# Patient Record
Sex: Female | Born: 1995 | Race: White | Hispanic: No | Marital: Single | Smoking: Never smoker
Health system: Southern US, Community
[De-identification: ages and names within clinical notes are randomized; demographics above are authoritative.]

## PROBLEM LIST (undated history)

## (undated) DIAGNOSIS — F32A Depression, unspecified: Secondary | ICD-10-CM

## (undated) DIAGNOSIS — K219 Gastro-esophageal reflux disease without esophagitis: Secondary | ICD-10-CM

## (undated) DIAGNOSIS — F419 Anxiety disorder, unspecified: Secondary | ICD-10-CM

## (undated) DIAGNOSIS — E282 Polycystic ovarian syndrome: Secondary | ICD-10-CM

## (undated) HISTORY — PX: ADENOIDECTOMY: SUR15

## (undated) HISTORY — PX: OTHER SURGICAL HISTORY: SHX169

---
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
## 2018-01-24 ENCOUNTER — Telehealth: Payer: Self-pay | Admitting: *Deleted

## 2018-01-24 NOTE — Telephone Encounter (Signed)
RN returned call regarding travel advice for trip 02/02/2018. RN left message asking for a call back, asking for [place]. Andree CossHowell, Niya Behler M, RN

## 2018-01-25 ENCOUNTER — Encounter: Payer: Self-pay | Admitting: Internal Medicine

## 2018-01-25 ENCOUNTER — Ambulatory Visit (INDEPENDENT_AMBULATORY_CARE_PROVIDER_SITE_OTHER): Payer: PRIVATE HEALTH INSURANCE | Admitting: Internal Medicine

## 2018-01-25 DIAGNOSIS — Z789 Other specified health status: Secondary | ICD-10-CM

## 2018-01-25 DIAGNOSIS — Z7184 Encounter for health counseling related to travel: Secondary | ICD-10-CM

## 2018-01-25 DIAGNOSIS — Z298 Encounter for other specified prophylactic measures: Secondary | ICD-10-CM

## 2018-01-25 DIAGNOSIS — Z9189 Other specified personal risk factors, not elsewhere classified: Secondary | ICD-10-CM

## 2018-01-25 DIAGNOSIS — Z7189 Other specified counseling: Secondary | ICD-10-CM

## 2018-01-25 DIAGNOSIS — Z7185 Encounter for immunization safety counseling: Secondary | ICD-10-CM

## 2018-01-25 MED ORDER — ATOVAQUONE-PROGUANIL HCL 250-100 MG PO TABS: 1.0000 | ORAL_TABLET | Freq: Every day | ORAL | 0 refills | Status: DC

## 2018-01-25 MED ORDER — AZITHROMYCIN 500 MG PO TABS: 1000.0000 mg | ORAL_TABLET | Freq: Once | ORAL | 0 refills | Status: AC

## 2018-01-25 MED ORDER — AZITHROMYCIN 500 MG PO TABS: 1000.0000 mg | ORAL_TABLET | Freq: Once | ORAL | 0 refills | Status: DC

## 2018-01-25 NOTE — Addendum Note (Signed)
Addended by: Valarie ConesSTALEY, Jamarrion Budai on: 01/25/2018 11:19 AM   Modules accepted: Orders

## 2018-01-25 NOTE — Progress Notes (Signed)
Subjective:   Karen Day is a 23 y.o. female who presents to the Infectious Disease clinic for travel consultation. Planned departure date: January 2020          Planned return date: 10 days Countries of travel: [place]  Areas in country: urban   Accommodations: hotel Purpose of travel: vacation Prior travel out of [place]: yes     Objective:   Medications: reviewed    Assessment:   No contraindications to travel. none     Plan:    Issues discussed: environmental concerns, freshwater swimming, future shots, insect-borne illnesses, Japanese encephalitis, malaria, MVA safety, rabies, safe food/water, traveler's diarrhea, website/handouts for more information, what to do if ill upon return and what to do if ill while there. Immunizations recommended: none indicated. Malaria prophylaxis: malarone, daily dose starting 1-2 days before entering endemic area, ending 7 days after leaving area Traveler's diarrhea prophylaxis: azithromycin. Total duration of visit: 1 Hour. Total time spent on education, counseling, coordination of care: 30 Minutes.

## 2018-01-25 NOTE — Patient Instructions (Signed)
[facility]                [address], [address]                   [place], [place] [address]                      Phone: [phone]                        Fax: [phone]   Planned departure date: January 2020        Planned return date:10 days Countries of travel: [place]    Guidelines for the Prevention & Treatment of Traveler's Diarrhea  Prevention: "Boil it, Peel it, Adriana Simas it, or Forget it"   the fewer chances -> lower risk: try to stick to food & water precautions as much as possible"   If it's "piping hot"; it is probably okay, if not, it may not be   Treatment   1) You should always take care to drink lots of fluids in order to avoid dehydration   2) You should bring medications with you in case you come down with a case of diarrhea   3) OTC = bring pepto-bismol - can take with initial abdominal symptoms;                    Imodium - can help slow down your intestinal tract, can help relief cramps                    and diarrhea, can take if no bloody diarrhea  Use azithromycin if needed for traveler's diarrhea  Guidelines for the Prevention of Malaria  Avoidance:  -fewer mosquito bites = lower risk. Mosquitos can bite at night as well as daytime  -cover up (long sleeve clothing), mosquito nets, screens  -Insect repellent for your skin ( DEET containing lotion > 20%): for clothes ( permethrin spray) www.insectshield.com (878)249-7099) for pretreated permethrin  2 days prior to travel, start malarone, daily dose starting 1-2 days before entering endemic area, ending 7 days after leaving area for malaria prevention.   Immunizations received today: none indicated  Future immunizations, if indicated none indicated   Prior to travel:  1) Be sure to pick up appropriate prescriptions, including medicine you take daily. Do not expect to be able to fill your prescriptions abroad.  2) Strongly consider obtaining traveler's  insurance, including emergency evacuation insurance. Most plans in the [place] do not cover participants abroad. (see below for resources)  3) Register at the appropriate [place] embassy or consulate with travel dates so they are aware of your presence in-country and for helpful advice during travel using the [employer] (STEP, GuyGalaxy.si).  4) Leave contact information with a relative or friend.  5) Keep a Corporate treasurer, credit cards in case they become lost or stolen  6) Inform your credit card company that you will be travelling abroad   During travel:  1) If you become ill and need medical advice, the [place] [employer] of the country you are traveling in general provides a list of English speaking doctors.  We are also available on MyChart for remote consultation if you register prior to travel. 2) Avoid motorcycles or scooters when at all possible. Traffic laws in many countries are lax and accidents occur frequently.  3) Do  not take any unnecessary risks that you wouldn't do at home.   Resources:  -Country specific information: BlindResource.ca or GreenNylon.com.cy  -Press photographer (DEET, mosquito nets): [employer], [employer] store, [employer], [place]ell insurance options: gatewayplans.com; http://clayton-rivera.info/; travelguard.com or Good Pilgrim's Pride, gninsurance.com or info@gninsurance .com, (680) 369-0561.   Post Travel:  If you return from your trip ill, call your primary care doctor or our travel clinic @ [phone].   Enjoy your trip and know that with proper pre-travel preparation, most people have an enjoyable and uninterrupted trip!

## 2018-03-11 LAB — HM HIV SCREENING LAB: HM HIV Screening: NEGATIVE

## 2018-03-11 LAB — HM PAP SMEAR: HM Pap smear: NEGATIVE

## 2018-09-02 ENCOUNTER — Telehealth: Payer: Self-pay | Admitting: Family Medicine

## 2018-09-02 NOTE — Telephone Encounter (Signed)
TC to patient who is concerned about facial skin (chin) breaking out in pimples and wondering if its related to [facility] which was placed in March, per patient. Patient talked with RN and provider, C. Hampton on speaker phone and was told that there is a possibility that the skin issues are related, but that it could also be caused by wearing facial masks also since about the time she had IUD placed. Patient told she could try OTC facial washes, and that if she wants IUD removed she can make an appointment. Patient states she'll think about it.Jenetta Downer, RN

## 2018-09-03 NOTE — Telephone Encounter (Signed)
Present when RN was on phone with patient.  Both RN and I spoke with the patient and the documentation  By RN reflects our discussion with patient re:  Acne as SE of Skyla and options for patient to consider.  Patient states that she will call back if she wants IUD removal and to start OCs.

## 2018-09-10 ENCOUNTER — Ambulatory Visit: Payer: PRIVATE HEALTH INSURANCE

## 2019-11-06 ENCOUNTER — Encounter: Payer: Self-pay | Admitting: Obstetrics and Gynecology

## 2020-12-12 ENCOUNTER — Ambulatory Visit (HOSPITAL_COMMUNITY): Admit: 2020-12-12 | Discharge status: Against Medical Advice | Payer: 59

## 2020-12-13 ENCOUNTER — Other Ambulatory Visit: Payer: Self-pay

## 2020-12-13 ENCOUNTER — Ambulatory Visit (INDEPENDENT_AMBULATORY_CARE_PROVIDER_SITE_OTHER): Payer: 59

## 2020-12-13 ENCOUNTER — Encounter (HOSPITAL_COMMUNITY): Payer: Self-pay

## 2020-12-13 ENCOUNTER — Ambulatory Visit (HOSPITAL_COMMUNITY)
Admission: RE | Admit: 2020-12-13 | Discharge: 2020-12-13 | Disposition: A | Discharge status: Home / Self Care | Payer: 59 | Source: Ambulatory Visit | Attending: Emergency Medicine | Admitting: Emergency Medicine

## 2020-12-13 ENCOUNTER — Ambulatory Visit
Admission: EM | Admit: 2020-12-13 | Discharge: 2020-12-13 | Disposition: A | Discharge status: Home / Self Care | Payer: 59 | Attending: Emergency Medicine | Admitting: Emergency Medicine

## 2020-12-13 VITALS — BP 118/75 | HR 68 | Temp 97.4°F | Resp 18

## 2020-12-13 DIAGNOSIS — M79672 Pain in left foot: Secondary | ICD-10-CM | POA: Diagnosis not present

## 2020-12-13 DIAGNOSIS — S92002A Unspecified fracture of left calcaneus, initial encounter for closed fracture: Secondary | ICD-10-CM

## 2020-12-13 DIAGNOSIS — M62838 Other muscle spasm: Secondary | ICD-10-CM | POA: Diagnosis not present

## 2020-12-13 DIAGNOSIS — S92015A Nondisplaced fracture of body of left calcaneus, initial encounter for closed fracture: Secondary | ICD-10-CM | POA: Diagnosis not present

## 2020-12-13 NOTE — ED Triage Notes (Signed)
Pt here for x-ray that her ins would not pay for out -Pt x-ray.
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
## 2020-12-13 NOTE — Discharge Instructions (Addendum)
Please report to [facility] main entrance to let them know that you have an x-ray ordered of your left foot.  Once I receive the results of that order we will contact you with further recommendations.  In the meantime, recommend supportive wrapping of the foot with an Ace wrap or brace, rest, ice and elevation.  For the muscle spasm in your neck, deep tissue massage, ice, heat are all helpful.  I anticipate that your discomfort will resolve in the next 2 to 3 days.  Thank you for visiting urgent care today, happy for better soon.

## 2020-12-13 NOTE — ED Notes (Signed)
Ortho tech called 
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
## 2020-12-13 NOTE — Discharge Instructions (Signed)
You have a fracture of your left heel  Please place boot on whenever walking, this is very important as it will keep your fracture from moving  Activity as tolerated  Follow-up with orthopedics in 1 to 2 weeks for reevaluation and timeline for healing, [facility] is located in most locations and you can start here if out-of-state   May use over-the-counter Tylenol or ibuprofen as needed for pain  Can apply heat or ice in 15-minute intervals for additional comfort

## 2022-06-23 IMAGING — DX DG FOOT COMPLETE 3+V*L*
3 series · 3 of 3 positions shown · non-contrast
Comparison: None.

CLINICAL DATA: Left foot injury 3 days ago

EXAM:
LEFT FOOT - COMPLETE 3+ VIEW

[foot ap]
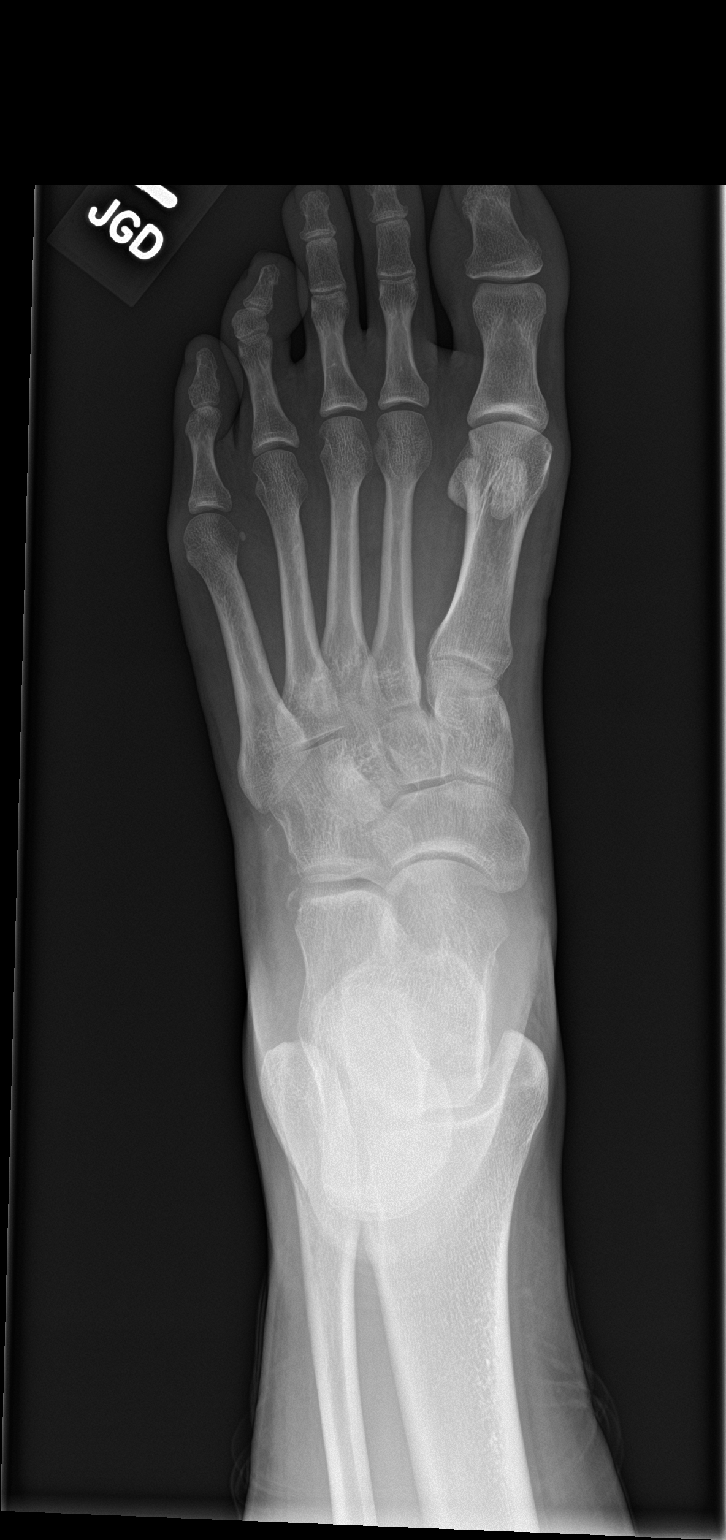

[foot obl]
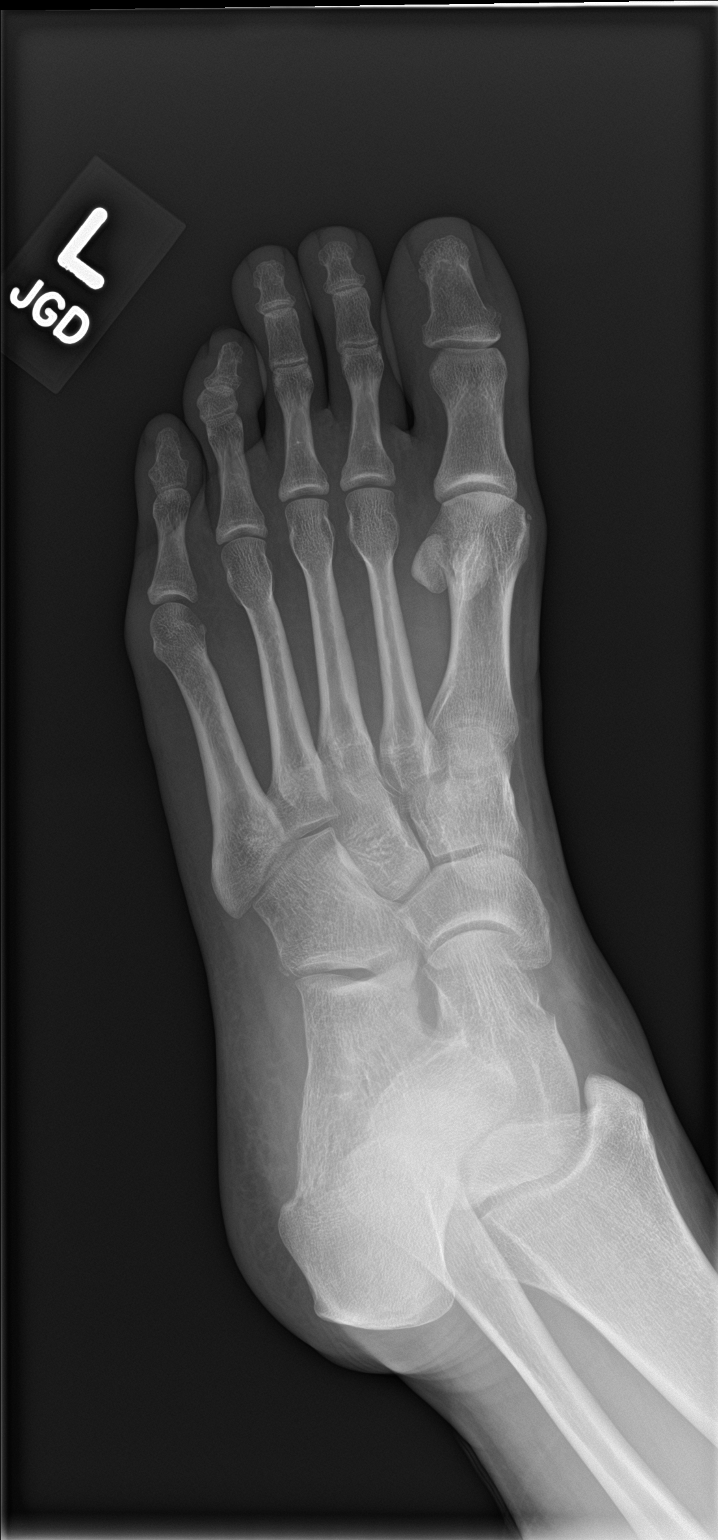

[foot lat]
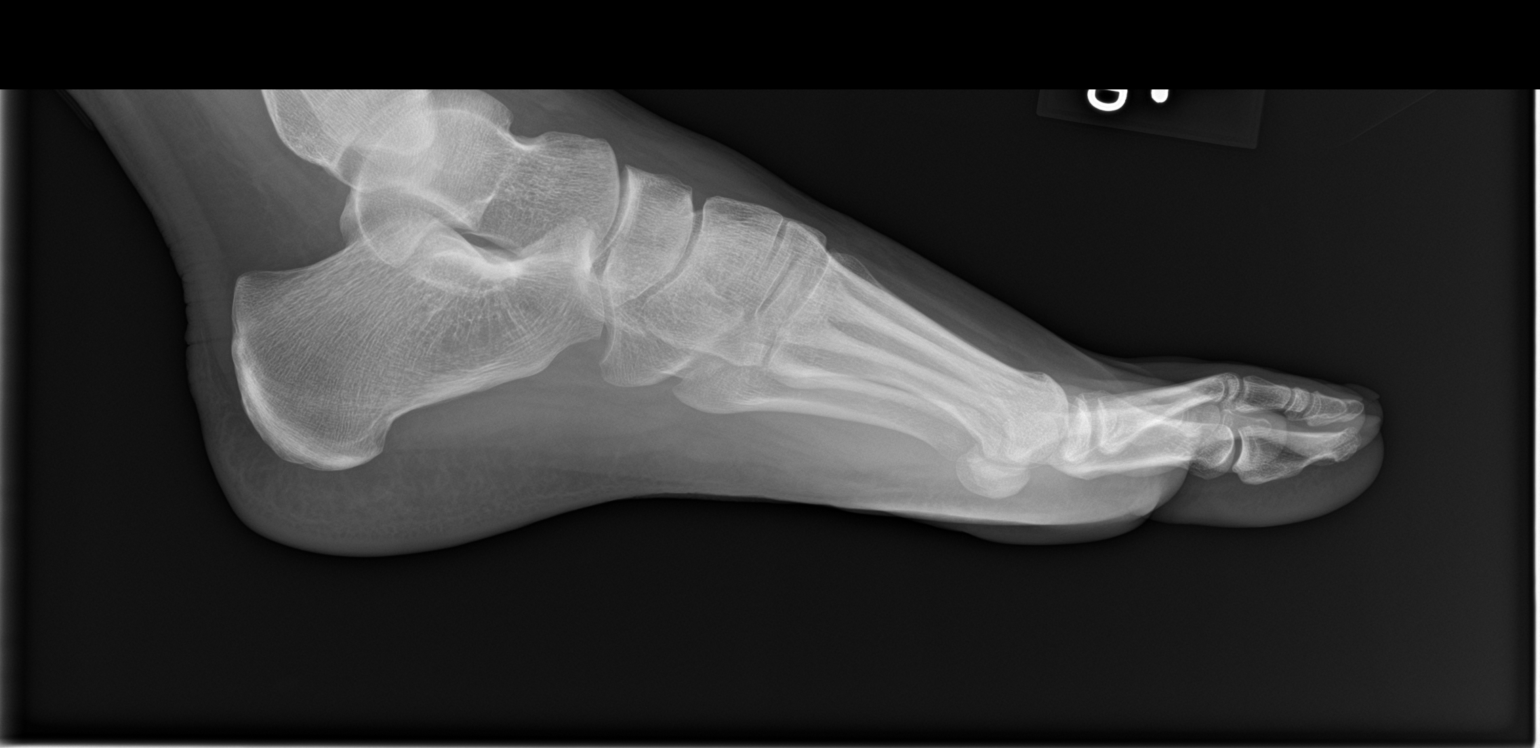

[3 of 3 positions shown; findings below may reference images not displayed]

FINDINGS: Frontal, oblique, and lateral views of the left foot are obtained.
There is a small avulsion fracture off the anterior aspect of the
calcaneus, along the anterolateral margin of the calcaneocuboid
joint. No other acute displaced fractures. Joint spaces are well
preserved. Soft tissues are normal.
IMPRESSION: 1. Small avulsion fracture off the distal calcaneus, extending along
the calcaneocuboid joint.

## 2023-02-15 ENCOUNTER — Other Ambulatory Visit: Payer: Self-pay

## 2023-02-15 ENCOUNTER — Ambulatory Visit
Admission: RE | Admit: 2023-02-15 | Discharge: 2023-02-15 | Disposition: A | Discharge status: Home / Self Care | Payer: Managed Care, Other (non HMO) | Source: Ambulatory Visit | Attending: Orthopedic Surgery | Admitting: Orthopedic Surgery

## 2023-02-15 ENCOUNTER — Ambulatory Visit: Payer: Self-pay | Admitting: Student

## 2023-02-15 ENCOUNTER — Encounter (HOSPITAL_COMMUNITY): Payer: Self-pay | Admitting: Student

## 2023-02-15 ENCOUNTER — Other Ambulatory Visit: Payer: Self-pay | Admitting: Orthopedic Surgery

## 2023-02-15 DIAGNOSIS — S82142A Displaced bicondylar fracture of left tibia, initial encounter for closed fracture: Secondary | ICD-10-CM

## 2023-02-15 NOTE — Progress Notes (Signed)
PCP - Merri Brunette     Cardiologist -   PPM/ICD - denies Device Orders - n/a Rep Notified - n/a  Chest x-ray - denies EKG - denies Stress Test - denies ECHO - denies Cardiac Cath - denies  CPAP - denies  Dm Denies  Blood Thinner Instructions: Denies Aspirin Instructions: n/a  ERAS Protcol - NPO  COVID TEST- n/a  Anesthesia review: no  Patient verbally denies any shortness of breath, fever, cough and chest pain during phone call   -------------  SDW INSTRUCTIONS given:  Your procedure is scheduled on February 16, 2023.  Report to [facility] Main Entrance "A" at 5:30 A.M., and check in at the Admitting office.  Call this number if you have problems the morning of surgery:  [phone]   Remember:  Do not eat or drink after midnight the night before your surgery      Take these medicines the morning of surgery with A SIP OF WATER  norethindrone-ethinyl estradiol (JUNEL FE,GILDESS FE,LOESTRIN FE)    As of today, STOP taking any Aspirin (unless otherwise instructed by your surgeon) Aleve, Naproxen, Ibuprofen, Motrin, Advil, Goody's, BC's, all herbal medications, fish oil, and all vitamins.                      Do not wear jewelry, make up, or nail polish            Do not wear lotions, powders, perfumes/colognes, or deodorant.            Do not shave 48 hours prior to surgery.  Men may shave face and neck.            Do not bring valuables to the hospital.            [facility] is not responsible for any belongings or valuables.  Do NOT Smoke (Tobacco/Vaping) 24 hours prior to your procedure If you use a CPAP at night, you may bring all equipment for your overnight stay.   Contacts, glasses, dentures or bridgework may not be worn into surgery.      For patients admitted to the hospital, discharge time will be determined by your treatment team.   Patients discharged the day of surgery will not be allowed to drive home, and someone needs to stay with them for 24  hours.    Special instructions:   [place]- Preparing For Surgery  Before surgery, you can play an important role. Because skin is not sterile, your skin needs to be as free of germs as possible. You can reduce the number of germs on your skin by washing with CHG (chlorahexidine gluconate) Soap before surgery.  CHG is an antiseptic cleaner which kills germs and bonds with the skin to continue killing germs even after washing.    Oral Hygiene is also important to reduce your risk of infection.  Remember - BRUSH YOUR TEETH THE MORNING OF SURGERY WITH YOUR REGULAR TOOTHPASTE  Please do not use if you have an allergy to CHG or antibacterial soaps. If your skin becomes reddened/irritated stop using the CHG.  Do not shave (including legs and underarms) for at least 48 hours prior to first CHG shower. It is OK to shave your face.  Please follow these instructions carefully.   Shower the NIGHT BEFORE SURGERY and the MORNING OF SURGERY with DIAL Soap.   Pat yourself dry with a CLEAN TOWEL.  Wear CLEAN PAJAMAS to bed the night before surgery  Place CLEAN SHEETS on your bed the night of your first shower and DO NOT SLEEP WITH PETS.   Day of Surgery: Please shower morning of surgery  Wear Clean/Comfortable clothing the morning of surgery Do not apply any deodorants/lotions.   Remember to brush your teeth WITH YOUR REGULAR TOOTHPASTE.   Questions were answered. Patient verbalized understanding of instructions.

## 2023-02-16 ENCOUNTER — Ambulatory Visit (HOSPITAL_COMMUNITY): Payer: Managed Care, Other (non HMO)

## 2023-02-16 ENCOUNTER — Ambulatory Visit (HOSPITAL_COMMUNITY): Payer: Managed Care, Other (non HMO) | Admitting: Anesthesiology

## 2023-02-16 ENCOUNTER — Encounter (HOSPITAL_COMMUNITY): Payer: Self-pay | Admitting: Student

## 2023-02-16 ENCOUNTER — Other Ambulatory Visit: Payer: Self-pay

## 2023-02-16 ENCOUNTER — Ambulatory Visit (HOSPITAL_COMMUNITY)
Admission: RE | Admit: 2023-02-16 | Discharge: 2023-02-16 | Disposition: A | Discharge status: Home / Self Care | Payer: Managed Care, Other (non HMO) | Attending: Student | Admitting: Student

## 2023-02-16 ENCOUNTER — Ambulatory Visit (HOSPITAL_BASED_OUTPATIENT_CLINIC_OR_DEPARTMENT_OTHER): Payer: Managed Care, Other (non HMO) | Admitting: Anesthesiology

## 2023-02-16 ENCOUNTER — Encounter (HOSPITAL_COMMUNITY)
Admission: RE | Disposition: A | Discharge status: Home / Self Care | Payer: Self-pay | Source: Home / Self Care | Attending: Student

## 2023-02-16 DIAGNOSIS — Y9323 Activity, snow (alpine) (downhill) skiing, snow boarding, sledding, tobogganing and snow tubing: Secondary | ICD-10-CM | POA: Diagnosis not present

## 2023-02-16 DIAGNOSIS — S82142A Displaced bicondylar fracture of left tibia, initial encounter for closed fracture: Secondary | ICD-10-CM | POA: Diagnosis present

## 2023-02-16 DIAGNOSIS — F418 Other specified anxiety disorders: Secondary | ICD-10-CM | POA: Diagnosis not present

## 2023-02-16 HISTORY — DX: Depression, unspecified: F32.A

## 2023-02-16 HISTORY — PX: ORIF TIBIA PLATEAU: SHX2132

## 2023-02-16 HISTORY — DX: Anxiety disorder, unspecified: F41.9

## 2023-02-16 HISTORY — DX: Polycystic ovarian syndrome: E28.2

## 2023-02-16 LAB — CBC
HCT: 38.5 % (ref 36.0–46.0)
Hemoglobin: 12.5 g/dL (ref 12.0–15.0)
MCH: 27.8 pg (ref 26.0–34.0)
MCHC: 32.5 g/dL (ref 30.0–36.0)
MCV: 85.7 fL (ref 80.0–100.0)
Platelets: 238 10*3/uL (ref 150–400)
RBC: 4.49 MIL/uL (ref 3.87–5.11)
RDW: 13.8 % (ref 11.5–15.5)
WBC: 10.8 10*3/uL — ABNORMAL HIGH (ref 4.0–10.5)
nRBC: 0 % (ref 0.0–0.2)

## 2023-02-16 LAB — POCT PREGNANCY, URINE: Preg Test, Ur: NEGATIVE

## 2023-02-16 SURGERY — OPEN REDUCTION INTERNAL FIXATION (ORIF) TIBIAL PLATEAU
Anesthesia: General | Site: Leg Lower | Laterality: Left

## 2023-02-16 MED ORDER — OXYCODONE HCL 5 MG/5ML PO SOLN: 5.0000 mg | Freq: Once | ORAL | Status: AC | PRN

## 2023-02-16 MED ORDER — ACETAMINOPHEN 500 MG PO TABS: 500.0000 mg | ORAL_TABLET | Freq: Four times a day (QID) | ORAL | Status: AC | PRN

## 2023-02-16 MED ORDER — METHOCARBAMOL 500 MG PO TABS: 500.0000 mg | ORAL_TABLET | Freq: Three times a day (TID) | ORAL | 0 refills | Status: AC | PRN

## 2023-02-16 MED ORDER — ONDANSETRON HCL 4 MG/2ML IJ SOLN
INTRAMUSCULAR | Status: AC
  Filled 2023-02-16: qty 2

## 2023-02-16 MED ORDER — PROPOFOL 10 MG/ML IV BOLUS
INTRAVENOUS | Status: DC | PRN
  Administered 2023-02-16: 200 mg via INTRAVENOUS

## 2023-02-16 MED ORDER — OXYCODONE HCL 5 MG PO TABS
5.0000 mg | ORAL_TABLET | Freq: Once | ORAL | Status: AC | PRN
  Administered 2023-02-16: 5 mg via ORAL

## 2023-02-16 MED ORDER — PROPOFOL 10 MG/ML IV BOLUS
INTRAVENOUS | Status: AC
  Filled 2023-02-16: qty 20

## 2023-02-16 MED ORDER — ONDANSETRON 4 MG PO TBDP: 4.0000 mg | ORAL_TABLET | Freq: Three times a day (TID) | ORAL | 0 refills | Status: AC | PRN

## 2023-02-16 MED ORDER — KETOROLAC TROMETHAMINE 15 MG/ML IJ SOLN
INTRAMUSCULAR | Status: DC | PRN
  Administered 2023-02-16: 15 mg via INTRAVENOUS

## 2023-02-16 MED ORDER — FENTANYL CITRATE (PF) 250 MCG/5ML IJ SOLN
INTRAMUSCULAR | Status: DC | PRN
  Administered 2023-02-16 (×2): 25 ug via INTRAVENOUS
  Administered 2023-02-16: 50 ug via INTRAVENOUS
  Administered 2023-02-16: 100 ug via INTRAVENOUS

## 2023-02-16 MED ORDER — VANCOMYCIN HCL 1000 MG IV SOLR
INTRAVENOUS | Status: DC | PRN
  Administered 2023-02-16: 1000 mg

## 2023-02-16 MED ORDER — ONDANSETRON HCL 4 MG/2ML IJ SOLN
INTRAMUSCULAR | Status: DC | PRN
  Administered 2023-02-16: 4 mg via INTRAVENOUS

## 2023-02-16 MED ORDER — CEFAZOLIN SODIUM-DEXTROSE 2-4 GM/100ML-% IV SOLN
2.0000 g | INTRAVENOUS | Status: AC
  Administered 2023-02-16: 2 g via INTRAVENOUS
  Filled 2023-02-16: qty 100

## 2023-02-16 MED ORDER — MIDAZOLAM HCL 2 MG/2ML IJ SOLN
INTRAMUSCULAR | Status: AC
  Filled 2023-02-16: qty 2

## 2023-02-16 MED ORDER — FENTANYL CITRATE (PF) 100 MCG/2ML IJ SOLN
INTRAMUSCULAR | Status: AC
  Administered 2023-02-16: 50 ug via INTRAVENOUS
  Filled 2023-02-16: qty 2

## 2023-02-16 MED ORDER — BUPIVACAINE-EPINEPHRINE (PF) 0.5% -1:200000 IJ SOLN
INTRAMUSCULAR | Status: DC | PRN
  Administered 2023-02-16: 15 mL via PERINEURAL

## 2023-02-16 MED ORDER — LACTATED RINGERS IV SOLN: INTRAVENOUS | Status: DC | PRN

## 2023-02-16 MED ORDER — PHENYLEPHRINE 80 MCG/ML (10ML) SYRINGE FOR IV PUSH (FOR BLOOD PRESSURE SUPPORT)
PREFILLED_SYRINGE | INTRAVENOUS | Status: AC
  Filled 2023-02-16: qty 10

## 2023-02-16 MED ORDER — EPHEDRINE SULFATE-NACL 50-0.9 MG/10ML-% IV SOSY
PREFILLED_SYRINGE | INTRAVENOUS | Status: DC | PRN
  Administered 2023-02-16: 5 mg via INTRAVENOUS

## 2023-02-16 MED ORDER — AMISULPRIDE (ANTIEMETIC) 5 MG/2ML IV SOLN
INTRAVENOUS | Status: AC
  Administered 2023-02-16: 10 mg via INTRAVENOUS
  Filled 2023-02-16: qty 4

## 2023-02-16 MED ORDER — HYDROCODONE-ACETAMINOPHEN 5-325 MG PO TABS: 1.0000 | ORAL_TABLET | ORAL | 0 refills | Status: AC | PRN

## 2023-02-16 MED ORDER — VANCOMYCIN HCL 1000 MG IV SOLR
INTRAVENOUS | Status: AC
  Filled 2023-02-16: qty 20

## 2023-02-16 MED ORDER — ROCURONIUM BROMIDE 10 MG/ML (PF) SYRINGE
PREFILLED_SYRINGE | INTRAVENOUS | Status: AC
  Filled 2023-02-16: qty 10

## 2023-02-16 MED ORDER — ACETAMINOPHEN 10 MG/ML IV SOLN
INTRAVENOUS | Status: AC
  Administered 2023-02-16: 1000 mg via INTRAVENOUS
  Filled 2023-02-16: qty 100

## 2023-02-16 MED ORDER — LIDOCAINE HCL (CARDIAC) PF 100 MG/5ML IV SOSY
PREFILLED_SYRINGE | INTRAVENOUS | Status: DC | PRN
  Administered 2023-02-16: 100 mg via INTRATRACHEAL

## 2023-02-16 MED ORDER — DEXAMETHASONE SODIUM PHOSPHATE 10 MG/ML IJ SOLN
INTRAMUSCULAR | Status: DC | PRN
  Administered 2023-02-16: 10 mg via INTRAVENOUS

## 2023-02-16 MED ORDER — LACTATED RINGERS IV SOLN: INTRAVENOUS | Status: DC

## 2023-02-16 MED ORDER — EPHEDRINE 5 MG/ML INJ
INTRAVENOUS | Status: AC
  Filled 2023-02-16: qty 5

## 2023-02-16 MED ORDER — ACETAMINOPHEN 500 MG PO TABS: 1000.0000 mg | ORAL_TABLET | Freq: Once | ORAL | Status: DC | PRN

## 2023-02-16 MED ORDER — FENTANYL CITRATE (PF) 100 MCG/2ML IJ SOLN
25.0000 ug | INTRAMUSCULAR | Status: DC | PRN
  Administered 2023-02-16 (×3): 25 ug via INTRAVENOUS

## 2023-02-16 MED ORDER — 0.9 % SODIUM CHLORIDE (POUR BTL) OPTIME
TOPICAL | Status: DC | PRN
  Administered 2023-02-16 (×2): 1000 mL

## 2023-02-16 MED ORDER — AMISULPRIDE (ANTIEMETIC) 5 MG/2ML IV SOLN: 10.0000 mg | Freq: Once | INTRAVENOUS | Status: AC

## 2023-02-16 MED ORDER — ACETAMINOPHEN 160 MG/5ML PO SOLN: 1000.0000 mg | Freq: Once | ORAL | Status: DC | PRN

## 2023-02-16 MED ORDER — DEXAMETHASONE SODIUM PHOSPHATE 10 MG/ML IJ SOLN
INTRAMUSCULAR | Status: AC
  Filled 2023-02-16: qty 1

## 2023-02-16 MED ORDER — MIDAZOLAM HCL 2 MG/2ML IJ SOLN
INTRAMUSCULAR | Status: DC | PRN
  Administered 2023-02-16: 2 mg via INTRAVENOUS

## 2023-02-16 MED ORDER — ORAL CARE MOUTH RINSE: 15.0000 mL | Freq: Once | OROMUCOSAL | Status: AC

## 2023-02-16 MED ORDER — FENTANYL CITRATE (PF) 250 MCG/5ML IJ SOLN
INTRAMUSCULAR | Status: AC
  Filled 2023-02-16: qty 5

## 2023-02-16 MED ORDER — ACETAMINOPHEN 10 MG/ML IV SOLN: 1000.0000 mg | Freq: Once | INTRAVENOUS | Status: DC | PRN

## 2023-02-16 MED ORDER — LIDOCAINE 2% (20 MG/ML) 5 ML SYRINGE
INTRAMUSCULAR | Status: AC
  Filled 2023-02-16: qty 5

## 2023-02-16 MED ORDER — CHLORHEXIDINE GLUCONATE 0.12 % MT SOLN
15.0000 mL | Freq: Once | OROMUCOSAL | Status: AC
  Administered 2023-02-16: 15 mL via OROMUCOSAL
  Filled 2023-02-16: qty 15

## 2023-02-16 MED ORDER — OXYCODONE HCL 5 MG PO TABS
ORAL_TABLET | ORAL | Status: AC
  Filled 2023-02-16: qty 1

## 2023-02-16 MED ORDER — PROPOFOL 500 MG/50ML IV EMUL
INTRAVENOUS | Status: DC | PRN
  Administered 2023-02-16: 40 ug/kg/min via INTRAVENOUS

## 2023-02-16 MED ORDER — ASPIRIN 81 MG PO TBEC: 81.0000 mg | DELAYED_RELEASE_TABLET | Freq: Every day | ORAL | 0 refills | Status: AC

## 2023-02-16 SURGICAL SUPPLY — 67 items
BAG COUNTER SPONGE SURGICOUNT (BAG) ×1 IMPLANT
BANDAGE ESMARK 6X9 LF (GAUZE/BANDAGES/DRESSINGS) ×1 IMPLANT
BIT DRILL QC 2.5MM SHRT EVO SM (DRILL) IMPLANT
BLADE CLIPPER SURG (BLADE) IMPLANT
BLADE SURG 15 STRL LF DISP TIS (BLADE) ×1 IMPLANT
BNDG ELASTIC 4INX 5YD STR LF (GAUZE/BANDAGES/DRESSINGS) IMPLANT
BNDG ELASTIC 4X5.8 VLCR STR LF (GAUZE/BANDAGES/DRESSINGS) ×1 IMPLANT
BNDG ELASTIC 6X5.8 VLCR STR LF (GAUZE/BANDAGES/DRESSINGS) ×1 IMPLANT
BNDG ESMARK 6X9 LF (GAUZE/BANDAGES/DRESSINGS) IMPLANT
BNDG GAUZE DERMACEA FLUFF 4 (GAUZE/BANDAGES/DRESSINGS) ×1 IMPLANT
BRUSH SCRUB EZ PLAIN DRY (MISCELLANEOUS) ×2 IMPLANT
CANISTER SUCT 3000ML PPV (MISCELLANEOUS) ×1 IMPLANT
CHLORAPREP W/TINT 26 (MISCELLANEOUS) ×2 IMPLANT
COVER SURGICAL LIGHT HANDLE (MISCELLANEOUS) ×1 IMPLANT
CUFF TRNQT CYL 34X4.125X (TOURNIQUET CUFF) ×1 IMPLANT
DERMABOND ADVANCED .7 DNX12 (GAUZE/BANDAGES/DRESSINGS) IMPLANT
DRAPE C-ARM 42X72 X-RAY (DRAPES) ×1 IMPLANT
DRAPE C-ARMOR (DRAPES) ×1 IMPLANT
DRAPE SURG ORHT 6 SPLT 77X108 (DRAPES) ×2 IMPLANT
DRAPE U-SHAPE 47X51 STRL (DRAPES) ×1 IMPLANT
DRESSING MEPILEX FLEX 4X4 (GAUZE/BANDAGES/DRESSINGS) IMPLANT
DRILL QC 2.5MM SHORT EVOS SM (DRILL) ×1 IMPLANT
DRSG MEPILEX FLEX 4X4 (GAUZE/BANDAGES/DRESSINGS) ×1 IMPLANT
DRSG MEPILEX POST OP 4X8 (GAUZE/BANDAGES/DRESSINGS) IMPLANT
ELECT REM PT RETURN 9FT ADLT (ELECTROSURGICAL) ×1 IMPLANT
ELECTRODE REM PT RTRN 9FT ADLT (ELECTROSURGICAL) ×1 IMPLANT
GAUZE PAD ABD 8X10 STRL (GAUZE/BANDAGES/DRESSINGS) ×2 IMPLANT
GAUZE SPONGE 4X4 12PLY STRL (GAUZE/BANDAGES/DRESSINGS) ×1 IMPLANT
GLOVE BIO SURGEON STRL SZ 6.5 (GLOVE) ×3 IMPLANT
GLOVE BIO SURGEON STRL SZ7.5 (GLOVE) ×4 IMPLANT
GLOVE BIOGEL PI IND STRL 6.5 (GLOVE) ×1 IMPLANT
GLOVE BIOGEL PI IND STRL 7.5 (GLOVE) ×1 IMPLANT
GOWN STRL REUS W/ TWL LRG LVL3 (GOWN DISPOSABLE) ×2 IMPLANT
IMMOBILIZER KNEE 22 UNIV (SOFTGOODS) ×1 IMPLANT
K-WIRE FX150X1.6XTROC PNT (WIRE) ×2 IMPLANT
KIT BASIN OR (CUSTOM PROCEDURE TRAY) ×1 IMPLANT
KIT TURNOVER KIT B (KITS) ×1 IMPLANT
KWIRE FX150X1.6XTROC PNT (WIRE) IMPLANT
NDL SUT 6 .5 CRC .975X.05 MAYO (NEEDLE) ×1 IMPLANT
NS IRRIG 1000ML POUR BTL (IV SOLUTION) ×1 IMPLANT
PACK TOTAL JOINT (CUSTOM PROCEDURE TRAY) ×1 IMPLANT
PAD ARMBOARD 7.5X6 YLW CONV (MISCELLANEOUS) ×2 IMPLANT
PAD CAST 4YDX4 CTTN HI CHSV (CAST SUPPLIES) ×1 IMPLANT
PAD CAST CTTN 4X4 STRL (SOFTGOODS) IMPLANT
PADDING CAST COTTON 6X4 STRL (CAST SUPPLIES) ×1 IMPLANT
PLATE TIB EVOX 3.5X78 5H LT (Plate) IMPLANT
SCREW CORT 3.5X44MM ST EVOS (Screw) IMPLANT
SCREW CORT EVOS ST 3.5X28 (Screw) IMPLANT
SCREW CTX 3.5X48MM EVOS (Screw) IMPLANT
SCREW CTX 3.5X50MM EVOS (Screw) IMPLANT
SCREW LOCK ST 3.5X48 (Screw) IMPLANT
SCREW LOCK ST EVOS 3.5 X 55 (Screw) IMPLANT
STAPLER VISISTAT 35W (STAPLE) ×1 IMPLANT
SUCTION TUBE FRAZIER 10FR DISP (SUCTIONS) ×1 IMPLANT
SUT ETHILON 2 0 FS 18 (SUTURE) ×1 IMPLANT
SUT ETHILON 3 0 PS 1 (SUTURE) IMPLANT
SUT FIBERWIRE #2 38 T-5 BLUE (SUTURE) IMPLANT
SUT MNCRL AB 3-0 PS2 18 (SUTURE) IMPLANT
SUT MON AB 2-0 CT1 36 (SUTURE) IMPLANT
SUT VIC AB 0 CT1 27XBRD ANBCTR (SUTURE) IMPLANT
SUT VIC AB 1 CT1 18XCR BRD 8 (SUTURE) IMPLANT
SUT VIC AB 1 CT1 27XBRD ANBCTR (SUTURE) ×1 IMPLANT
SUT VIC AB 2-0 CT1 TAPERPNT 27 (SUTURE) ×2 IMPLANT
SUTURE FIBERWR #2 38 T-5 BLUE (SUTURE) IMPLANT
TOWEL GREEN STERILE (TOWEL DISPOSABLE) ×2 IMPLANT
TRAY FOLEY MTR SLVR 16FR STAT (SET/KITS/TRAYS/PACK) IMPLANT
WATER STERILE IRR 1000ML POUR (IV SOLUTION) ×2 IMPLANT

## 2023-02-16 NOTE — Discharge Instructions (Addendum)
Orthopaedic Trauma Service Discharge Instructions   General Discharge Instructions  WEIGHT BEARING STATUS:Non-weightbearing left lower extremity in hinged knee brace  RANGE OF MOTION/ACTIVITY: Ok for knee range of motion as tolerated in the hinged brace. Please lock brace in full extension at night  Wound Care: You may remove your surgical dressing on post op day #2, (Sunday 02/18/23). Incisions can be left open to air if there is no drainage. Once the incision is completely dry and without drainage, it may be left open to air out.  Showering may begin post op day #4, (Tuesday 02/20/23).  Clean incision gently with soap and water. You may remove your brace for showering.   DVT/PE prophylaxis: Aspirin 81 mg daily x 30 days  Diet: as you were eating previously.  Can use over the counter stool softeners and bowel preparations, such as Miralax, to help with bowel movements.  Narcotics can be constipating.  Be sure to drink plenty of fluids  PAIN MEDICATION USE AND EXPECTATIONS  You have likely been given narcotic medications to help control your pain.  After a traumatic event that results in an fracture (broken bone) with or without surgery, it is ok to use narcotic pain medications to help control one's pain.  We understand that everyone responds to pain differently and each individual patient will be evaluated on a regular basis for the continued need for narcotic medications. Ideally, narcotic medication use should last no more than 6-8 weeks (coinciding with fracture healing).   As a patient it is your responsibility as well to monitor narcotic medication use and report the amount and frequency you use these medications when you come to your office visit.   We would also advise that if you are using narcotic medications, you should take a dose prior to therapy to maximize you participation.  IF YOU ARE ON NARCOTIC MEDICATIONS IT IS NOT PERMISSIBLE TO OPERATE A MOTOR VEHICLE  (MOTORCYCLE/CAR/TRUCK/MOPED) OR HEAVY MACHINERY DO NOT MIX NARCOTICS WITH OTHER CNS (CENTRAL NERVOUS SYSTEM) DEPRESSANTS SUCH AS ALCOHOL   STOP SMOKING OR USING NICOTINE PRODUCTS!!!!  As discussed nicotine severely impairs your body's ability to heal surgical and traumatic wounds but also impairs bone healing.  Wounds and bone heal by forming microscopic blood vessels (angiogenesis) and nicotine is a vasoconstrictor (essentially, shrinks blood vessels).  Therefore, if vasoconstriction occurs to these microscopic blood vessels they essentially disappear and are unable to deliver necessary nutrients to the healing tissue.  This is one modifiable factor that you can do to dramatically increase your chances of healing your injury.    (This means no smoking, no nicotine gum, patches, etc)  DO NOT USE NONSTEROIDAL ANTI-INFLAMMATORY DRUGS (NSAID'S)  Using products such as Advil (ibuprofen), Aleve (naproxen), Motrin (ibuprofen) for additional pain control during fracture healing can delay and/or prevent the healing response.  If you would like to take over the counter (OTC) medication, Tylenol (acetaminophen) is ok.  However, some narcotic medications that are given for pain control contain acetaminophen as well. Therefore, you should not exceed more than 4000 mg of tylenol in a day if you do not have liver disease.  Also note that there are may OTC medicines, such as cold medicines and allergy medicines that my contain tylenol as well.  If you have any questions about medications and/or interactions please ask your doctor/PA or your pharmacist.      ICE AND ELEVATE INJURED/OPERATIVE EXTREMITY  Using ice and elevating the injured extremity above your heart can help with  swelling and pain control.  Icing in a pulsatile fashion, such as 20 minutes on and 20 minutes off, can be followed.    Do not place ice directly on skin. Make sure there is a barrier between to skin and the ice pack.    Using frozen items  such as frozen peas works well as the conform nicely to the are that needs to be iced.  USE AN ACE WRAP OR TED HOSE FOR SWELLING CONTROL  In addition to icing and elevation, Ace wraps or TED hose are used to help limit and resolve swelling.  It is recommended to use Ace wraps or TED hose until you are informed to stop.    When using Ace Wraps start the wrapping distally (farthest away from the body) and wrap proximally (closer to the body)   Example: If you had surgery on your leg or thing and you do not have a splint on, start the ace wrap at the toes and work your way up to the thigh        If you had surgery on your upper extremity and do not have a splint on, start the ace wrap at your fingers and work your way up to the upper arm   CALL THE OFFICE WITH ANY QUESTIONS OR CONCERNS: [phone]   VISIT OUR WEBSITE FOR ADDITIONAL INFORMATION: [email or web address]   Discharge Wound Care Instructions  Do NOT apply any ointments, solutions or lotions to pin sites or surgical wounds.  These prevent needed drainage and even though solutions like hydrogen peroxide kill bacteria, they also damage cells lining the pin sites that help fight infection.  Applying lotions or ointments can keep the wounds moist and can cause them to breakdown and open up as well. This can increase the risk for infection. When in doubt call the office.  Surgical incisions should be dressed daily.  If any drainage is noted, use one layer of adaptic or Mepitel, then gauze, Kerlix, and an ace wrap. - These dressing supplies should be available at local medical supply stores [facility], [facility], etc) as well as Insurance claims handler ([employer], [facility], [place], etc)  Once the incision is completely dry and without drainage, it may be left open to air out.  Showering may begin 36-48 hours later.  Cleaning gently with soap and water.

## 2023-02-16 NOTE — Progress Notes (Signed)
Orthopedic Tech Progress Note Patient Details:  Karen Day 1995-06-07 [id number]  Patient ID: Karen Day, female   DOB: 12-09-1995, 28 y.o.   MRN: [id number] Delivered Bledsoe knee brace from [employer]. Tonye Pearson 02/16/2023, 10:32 AM

## 2023-02-16 NOTE — Anesthesia Procedure Notes (Signed)
Anesthesia Regional Block: Adductor canal block   Pre-Anesthetic Checklist: , timeout performed,  Correct Patient, Correct Site, Correct Laterality,  Correct Procedure, Correct Position, site marked,  Risks and benefits discussed,  Surgical consent,  Pre-op evaluation,  At surgeon's request and post-op pain management  Laterality: Left and Lower  Prep: chloraprep       Needles:  Injection technique: Single-shot      Needle Length: 9cm  Needle Gauge: 22     Additional Needles: Arrow StimuQuik ECHO Echogenic Stimulating PNB Needle  Procedures:,,,, ultrasound used (permanent image in chart),,    Narrative:  Start time: 02/16/2023 7:51 AM End time: 02/16/2023 7:57 AM Injection made incrementally with aspirations every 5 mL.  Performed by: Personally  Anesthesiologist: Val Eagle, MD

## 2023-02-16 NOTE — Anesthesia Procedure Notes (Signed)
Procedure Name: LMA Insertion Date/Time: 02/16/2023 7:54 AM  Performed by: Allyn Kenner, CRNAPre-anesthesia Checklist: Patient identified, Emergency Drugs available, Suction available and Patient being monitored Patient Re-evaluated:Patient Re-evaluated prior to induction Oxygen Delivery Method: Circle System Utilized Preoxygenation: Pre-oxygenation with 100% oxygen Induction Type: IV induction Ventilation: Mask ventilation without difficulty LMA: LMA inserted LMA Size: 4.0 Number of attempts: 1 Airway Equipment and Method: Bite block Placement Confirmation: positive ETCO2 Tube secured with: Tape Dental Injury: Teeth and Oropharynx as per pre-operative assessment

## 2023-02-16 NOTE — Anesthesia Preprocedure Evaluation (Addendum)
Anesthesia Evaluation  Patient identified by MRN, date of birth, ID band Patient awake    Reviewed: Allergy & Precautions, NPO status , Patient's Chart, lab work & pertinent test results  History of Anesthesia Complications Negative for: history of anesthetic complications  Airway Mallampati: II  TM Distance: >3 FB Neck ROM: Full    Dental  (+) Teeth Intact, Dental Advisory Given   Pulmonary neg shortness of breath, neg sleep apnea, neg COPD, neg recent URI   breath sounds clear to auscultation       Cardiovascular negative cardio ROS  Rhythm:Regular     Neuro/Psych  PSYCHIATRIC DISORDERS Anxiety Depression    negative neurological ROS     GI/Hepatic Neg liver ROS,GERD  ,,  Endo/Other  negative endocrine ROS    Renal/GU negative Renal ROS     Musculoskeletal  Left tibial plateau fracture   Abdominal   Peds  Hematology negative hematology ROS (+)   Anesthesia Other Findings   Reproductive/Obstetrics Lab Results      Component                Value               Date                      PREGTESTUR               NEGATIVE            02/16/2023                                        Anesthesia Physical Anesthesia Plan  ASA: 2  Anesthesia Plan: General and Regional   Post-op Pain Management: Regional block*   Induction:   PONV Risk Score and Plan: 4 or greater and Ondansetron and Dexamethasone  Airway Management Planned: LMA  Additional Equipment: None  Intra-op Plan:   Post-operative Plan: Extubation in OR  Informed Consent: I have reviewed the patients History and Physical, chart, labs and discussed the procedure including the risks, benefits and alternatives for the proposed anesthesia with the patient or authorized representative who has indicated his/her understanding and acceptance.     Dental advisory given  Plan Discussed with: CRNA  Anesthesia Plan Comments:         Anesthesia Quick Evaluation

## 2023-02-16 NOTE — Transfer of Care (Signed)
Immediate Anesthesia Transfer of Care Note  Patient: Karen Day  Procedure(s) Performed: OPEN REDUCTION INTERNAL FIXATION (ORIF) TIBIAL PLATEAU (Left: Leg Lower)  Patient Location: PACU  Anesthesia Type:General and Regional  Level of Consciousness: awake, alert , and oriented  Airway & Oxygen Therapy: Patient Spontanous Breathing and Patient connected to face mask oxygen  Post-op Assessment: Report given to RN and Post -op Vital signs reviewed and stable  Post vital signs: Reviewed and stable  Last Vitals:  Vitals Value Taken Time  BP 100/61 02/16/23 1002  Temp    Pulse 87 02/16/23 1004  Resp 15 02/16/23 1004  SpO2 100 % 02/16/23 1004  Vitals shown include unfiled device data.  Last Pain:  Vitals:   02/16/23 0606  TempSrc:   PainSc: 4       Patients Stated Pain Goal: 0 (02/16/23 0606)  Complications: No notable events documented.

## 2023-02-16 NOTE — H&P (Signed)
Orthopaedic Trauma Service (OTS) Consult   Patient ID: Karen Day MRN: [id number] DOB/AGE: 27/21/1997 27 y.o.  Reason for Consult:Left tibial plateau fracture Referring Physician: Dr. Beverely Low, MD [facility]  HPI: Karen Day is an 28 y.o. female who is being seen in consultation at the request of Dr. Ranell Patrick for evaluation of left tibial plateau fracture.  Patient was in [place] for vacation when she was skiing she got her knee caught sustained a proximal tibial plateau fracture.  The patient was placed in a long-leg splint and sent home.  Due to family connections with Dr. Ranell Patrick he reached out to me due to complexity of her injury.  CT scan was obtained yesterday which showed a posterior medial plateau fracture with involvement of the tibial spines.  Patient was given Lovenox and she has been taking it until yesterday morning.  She has not taken it this morning under my advisement.  Patient presents with her mother.  She is currently comfortable.  Denies any numbness or tingling denies any other injuries.  She is a Emergency planning/management officer.  She is very active outside of work.  She ambulates without assist device and his runs and is very physically active.  Denies any significant tobacco use.  He  Past Medical History:  Diagnosis Date   Acid reflux    Anxiety    Depression    PCOS (polycystic ovarian syndrome)     Past Surgical History:  Procedure Laterality Date   ADENOIDECTOMY     Left radial head      Family History  Problem Relation Age of Onset   Heart disease Father    Heart disease Maternal Grandfather    Hypertension Paternal Grandmother    Cancer Paternal Grandfather     Social History:  reports that she has never smoked. She does not have any smokeless tobacco history on file. She reports current alcohol use. She reports that she does not use drugs.  Allergies:  Allergies  Allergen Reactions   Mucinex [Guaifenesin Er] Rash    Medications:  No current  facility-administered medications on file prior to encounter.   Current Outpatient Medications on File Prior to Encounter  Medication Sig Dispense Refill   calcium carbonate (TUMS - DOSED IN MG ELEMENTAL CALCIUM) 500 MG chewable tablet Chew 1-2 tablets by mouth daily as needed. For heartburn.     norethindrone-ethinyl estradiol (JUNEL FE,GILDESS FE,LOESTRIN FE) 1-20 MG-MCG tablet Take 1 tablet by mouth daily.     paragard intrauterine copper IUD IUD 1 each by Intrauterine route once.     ranitidine (ZANTAC) 150 MG tablet Take 150 mg by mouth daily as needed. For heartburn or acid reflux.     sertraline (ZOLOFT) 50 MG tablet Take 50 mg by mouth daily.     spironolactone (ALDACTONE) 100 MG tablet Take 100 mg by mouth daily.     atovaquone-proguanil (MALARONE) 250-100 MG TABS tablet Take 1 tablet by mouth daily. Start 2 days prior to travel to malaria area, throughout travel and for 7 days upon return. (Patient not taking: Reported on 02/15/2023) 19 tablet 0   cholecalciferol (VITAMIN D3) 25 MCG (1000 UNIT) tablet Take 1,000 Units by mouth daily.     ibuprofen (ADVIL,MOTRIN) 200 MG tablet Take 600-800 mg by mouth every 6 (six) hours as needed. For pain. (Patient not taking: Reported on 02/15/2023)     L-LYSINE PO Take 1 tablet by mouth daily. (Patient not taking: Reported on 02/15/2023)     Levonorgestrel (  SKYLA) 13.5 MG IUD by Intrauterine route. (Patient not taking: Reported on 02/15/2023)     Multiple Vitamin (MULITIVITAMIN WITH MINERALS) TABS Take 1 tablet by mouth daily. (Patient not taking: Reported on 02/15/2023)     Probiotic Product (PROBIOTIC PO) Take 1 tablet by mouth daily. (Patient not taking: Reported on 02/15/2023)       ROS: Constitutional: No fever or chills Vision: No changes in vision ENT: No difficulty swallowing CV: No chest pain Pulm: No SOB or wheezing GI: No nausea or vomiting GU: No urgency or inability to hold urine Skin: No poor wound healing Neurologic: No numbness or  tingling Psychiatric: No depression or anxiety Heme: No bruising Allergic: No reaction to medications or food   Exam: Blood pressure 114/75, pulse 80, temperature 98.3 F (36.8 C), temperature source Oral, resp. rate 18, height 5\' 2"  (1.575 m), weight 65.8 kg, last menstrual period 01/18/2023, SpO2 97%. General: No acute distress Orientation: Awake alert and oriented x 3 Mood and Affect: Cooperative and pleasant Gait: Unable to assess due to fracture Coordination and balance: Within normal limits  Left lower extremity: Splint cut down and opened up.  Skin is clean dry and intact no skin lesions.  Soft tissue wrinkles appropriately for surgical incision.  Compartments are soft and compressible.  Warm well-perfused foot with brisk cap refill.  She has active dorsiflexion plantarflexion of her toes and ankle without any defect.  Right lower extremity: Skin without lesions. No tenderness to palpation. Full painless ROM, full strength in each muscle groups without evidence of instability.   Medical Decision Making: Data: Imaging: CT scan was been reviewed which shows a large posterior medial fragment with comminution of the tibial spines with avulsion of the ACL insertion  Labs:  Results for orders placed or performed during the hospital encounter of 02/16/23 (from the past 24 hours)  Pregnancy, urine POC     Status: None   Collection Time: 02/16/23  5:56 AM  Result Value Ref Range   Preg Test, Ur NEGATIVE NEGATIVE  CBC per protocol     Status: Abnormal   Collection Time: 02/16/23  5:58 AM  Result Value Ref Range   WBC 10.8 (H) 4.0 - 10.5 K/uL   RBC 4.49 3.87 - 5.11 MIL/uL   Hemoglobin 12.5 12.0 - 15.0 g/dL   HCT 08.6 57.8 - 46.9 %   MCV 85.7 80.0 - 100.0 fL   MCH 27.8 26.0 - 34.0 pg   MCHC 32.5 30.0 - 36.0 g/dL   RDW 62.9 52.8 - 41.3 %   Platelets 238 150 - 400 K/uL   nRBC 0.0 0.0 - 0.2 %     Imaging or Labs ordered: None  Medical history and chart was reviewed and case  discussed with medical provider.  Assessment/Plan: 28 year old female with left complex tibial plateau fracture.  Due to the unstable nature of her injury I recommend proceeding with open reduction internal fixation.  We will address the posterior medial fragment and potentially perform a open reduction and fixation of the tibial spines.  Risks and benefits were discussed with the patient and her parents.  Risks included but not limited to bleeding, infection, malunion, nonunion, hardware failure, hardware rotation, nerve and blood vessel injury, knee stiffness, posttraumatic arthritis, knee instability, DVT, even the possibility anesthetic complications.  She agrees to proceed with surgery and consent was obtained.  Roby Lofts, MD [facility] [phone] (office) [email or web address]

## 2023-02-16 NOTE — Op Note (Signed)
Orthopaedic Surgery Operative Note (CSN: [id number] ) Date of Surgery: 02/16/2023  Admit Date: 02/16/2023   Diagnoses: Pre-Op Diagnoses: Left bicondylar tibial plateau fracture  Post-Op Diagnosis: Same  Procedures: CPT 27536-Open reduction internal fixation of left bicondylar tibial plateau fracture  Surgeons : Primary: Roby Lofts, MD  Assistant: Ulyses Southward, PA-C  Location: OR 3   Anesthesia: General with regional block   Antibiotics: Ancef 2g preop with 1 gm vancomycin powder placed topically   Tourniquet time: None used    Estimated Blood Loss: 100 mL  Complications: None   Specimens:* No specimens in log *   Implants: Implant Name Type Inv. Item Serial No. Manufacturer Lot No. LRB No. Used Action  SCREW CORT 3.5X44MM ST EVOS - [id number] Screw SCREW CORT 3.5X44MM ST EVOS  [employer]  Left 2 Implanted  SCREW CTX 3.5X48MM EVOS - [id number] Screw SCREW CTX 3.5X48MM EVOS  [employer]  Left 1 Implanted  PLATE TIB EVOX 2.9B28 5H LT - [id number] Plate PLATE TIB EVOX 7.2Z36 5H LT  [employer]  Left 1 Implanted  SCREW CORT EVOS ST 3.5X28 - [id number] Screw SCREW CORT EVOS ST 3.5X28  [employer]  Left 1 Implanted  SCREW CTX 3.5X50MM EVOS - [id number] Screw SCREW CTX 3.5X50MM EVOS  [employer]  Left 1 Implanted  SCREW LOCK ST 3.5X48 - [id number] Screw SCREW LOCK ST 3.5X48  [employer]  Left 1 Implanted  SCREW LOCK ST EVOS 3.5 X 55 - [id number] Screw SCREW LOCK ST EVOS 3.5 X 55  [employer]  Left 1 Implanted     Indications for Surgery: 28 year old female who injured her knee while skiing in [place].  She sustained a bicondylar tibial plateau fracture with a large posterior medial fragment.  Due to complexity of her injury was felt to need treatment by an orthopedic traumatologist.  I felt that she was indicated for open reduction internal fixation.  Risks and  benefits were discussed with the patient and her family.  Risks included but not limited to bleeding, infection, malunion, nonunion, hardware failure, hardware irritation, nerve and blood vessel injury, posttraumatic arthritis, knee stiffness, DVT, and even the possibility of anesthetic complications.  They agreed to proceed with surgery and consent was obtained.  Operative Findings: 1.  Open reduction internal fixation of left bicondylar tibial plateau fracture through a medial approach using a [employer] EVOS 3.5 mm posterior medial proximal tibial locking plate 2.  After fixation of the tibial plateau there is no instability of the knee with stable exam to Lachman, anterior drawer and pivot shift.  So no accessory incision was made for tibial spine fixation.  Procedure: The patient was identified in the preoperative holding area. Consent was confirmed with the patient and their family and all questions were answered. The operative extremity was marked after confirmation with the patient. she was then brought back to the operating room by our anesthesia colleagues.  She was carefully transferred over to radiolucent flattop table.  She was placed under general anesthetic.  A adductor canal block was done and performed by the anesthesiologist.  The left lower extremity was then prepped and draped in usual sterile fashion.  A timeout was performed to verify the patient, the procedure, and the extremity.  Preoperative antibiotics were dosed.  Fluoroscopic imaging was obtained to show the unstable nature of her injury.  The hip and knee were flexed over a triangle.  A direct medial approach was made and carried down through skin subcutaneous tissue.  Carefully dissected through the soft tissues and identified the hamstring tendons coming across the MCL.  I then reflected the crural fascia off of the posterior tibia and released the gastroc soleus off of the posterior tibia just posterior to the MCL.  I  continued this dissection back until I was able to visualize the posterior medial fragment.  There is a large spike that had a good cortical read.  I entered the fragment and cleaned out some of the bony fragments that were present within the fracture to prevent any malreduction of the articular surface.  I then extended the knee used a reduction tenaculum with a percutaneous incision anteriorly to reduce the spike of the posterior medial fragment.  I was able to get this anatomic but there is still some gapping at the articular surface itself.  I then proceeded to place a clamp more proximal at the articular surface to close down this articular gap.  Once I had anatomic reduction of the articular surface I then made a anterior incision to place anterior to posterior 3.5 mm nonlocking screws to provisionally hold the fragment.  I then placed a 1.6 mm K wires in the fragment as well to reinforce the provisional fixation while removed the clamps.  I then positioned a [employer] EVOS posterior medial plate and confirmed positioning with fluoroscopy.  I then drilled and placed nonlocking screws into the tibial shaft and I placed locking screws into the proximal segment.  Excellent fixation was obtained.  I then removed the K wires and then stressed the knee with a lock maneuver as well as anterior posterior drawer and pivot shift which there is no notable ACL insufficiency.  I felt that an accessory incision was not needed.  I again stressed the need under live fluoroscopic imaging and there was really no significant anterior tibial translation.  Final fluoroscopic imaging was obtained.  The incision was copiously irrigated.  A gram of vancomycin powder was placed into the incision.  A layered closure of 0 Vicryl, 2-0 Monocryl and 3-0 Monocryl with Dermabond was used to close the skin.  Sterile dressings were applied.  The patient was then awoken from anesthesia and taken to the PACU in stable condition.  Post  Op Plan/Instructions: The patient will be nonweightbearing to the left lower extremity.  Shelf unrestricted range of motion during the day and I want her locked in extension at night to prevent a flexion contracture.  She will receive aspirin 81 mg for DVT prophylaxis.  I will have her return in 1 to 2 weeks for x-rays and incision check at which point we will likely start outpatient physical therapy.  I was present and performed the entire surgery.  Ulyses Southward, PA-C did assist me throughout the case. An assistant was necessary given the difficulty in approach, maintenance of reduction and ability to instrument the fracture.   Truitt Merle, MD [facility]

## 2023-02-17 ENCOUNTER — Encounter (HOSPITAL_COMMUNITY): Payer: Self-pay | Admitting: Student

## 2023-02-19 NOTE — Anesthesia Postprocedure Evaluation (Signed)
Anesthesia Post Note  Patient: Karen Day  Procedure(s) Performed: OPEN REDUCTION INTERNAL FIXATION (ORIF) TIBIAL PLATEAU (Left: Leg Lower)     Patient location during evaluation: PACU Anesthesia Type: General and Regional Level of consciousness: awake and alert Pain management: pain level controlled Vital Signs Assessment: post-procedure vital signs reviewed and stable Respiratory status: spontaneous breathing, nonlabored ventilation and respiratory function stable Cardiovascular status: blood pressure returned to baseline and stable Postop Assessment: no apparent nausea or vomiting Anesthetic complications: no   There were no known notable events for this encounter.                Uzziah Rigg
